# Patient Record
Sex: Male | Born: 1990 | Race: Black or African American | Hispanic: No | Marital: Single | Smoking: Current every day smoker
Health system: Southern US, Community
[De-identification: ages and names within clinical notes are randomized; demographics above are authoritative.]

---
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
## 2017-05-11 ENCOUNTER — Emergency Department (HOSPITAL_COMMUNITY)
Admission: EM | Admit: 2017-05-11 | Discharge: 2017-05-11 | Disposition: A | Discharge status: Home / Self Care | Payer: Self-pay | Attending: Emergency Medicine | Admitting: Emergency Medicine

## 2017-05-11 ENCOUNTER — Other Ambulatory Visit: Payer: Self-pay

## 2017-05-11 ENCOUNTER — Encounter (HOSPITAL_COMMUNITY): Payer: Self-pay

## 2017-05-11 DIAGNOSIS — N390 Urinary tract infection, site not specified: Secondary | ICD-10-CM

## 2017-05-11 DIAGNOSIS — F17210 Nicotine dependence, cigarettes, uncomplicated: Secondary | ICD-10-CM | POA: Insufficient documentation

## 2017-05-11 DIAGNOSIS — R310 Gross hematuria: Secondary | ICD-10-CM

## 2017-05-11 LAB — URINALYSIS, ROUTINE W REFLEX MICROSCOPIC
Bacteria, UA: NONE SEEN
Bilirubin Urine: NEGATIVE
Glucose, UA: NEGATIVE mg/dL
Ketones, ur: NEGATIVE mg/dL
Nitrite: POSITIVE — AB
Protein, ur: NEGATIVE mg/dL
Specific Gravity, Urine: 1.019 (ref 1.005–1.030)
pH: 5 (ref 5.0–8.0)

## 2017-05-11 MED ORDER — AZITHROMYCIN 250 MG PO TABS
1000.0000 mg | ORAL_TABLET | Freq: Once | ORAL | Status: AC
  Administered 2017-05-11: 1000 mg via ORAL
  Filled 2017-05-11: qty 4

## 2017-05-11 MED ORDER — CEPHALEXIN 500 MG PO CAPS: 500.0000 mg | ORAL_CAPSULE | Freq: Four times a day (QID) | ORAL | 0 refills | Status: AC

## 2017-05-11 MED ORDER — CEFTRIAXONE SODIUM 250 MG IJ SOLR
250.0000 mg | Freq: Once | INTRAMUSCULAR | Status: AC
  Administered 2017-05-11: 250 mg via INTRAMUSCULAR
  Filled 2017-05-11: qty 250

## 2017-05-11 NOTE — Discharge Instructions (Addendum)
It was our pleasure to provide your ER care today - we hope that you feel better.  Take antibiotic as prescribed.   Follow up with primary care doctor in 1 week if symptoms fail to improve/resolve.  Return to ER if worse, new symptoms, severe abdominal pain, unable to urinate, high fevers, other concern.

## 2017-05-11 NOTE — ED Triage Notes (Signed)
States this am woke up with blood in urine no pain voiced no discharge voiced no back pain voiced no fever voiced.

## 2017-05-11 NOTE — ED Notes (Signed)
Bed: WA03 Expected date:  Expected time:  Means of arrival:  Comments: 

## 2017-05-11 NOTE — ED Provider Notes (Signed)
[facility]-EMERGENCY DEPT Provider Note   CSN: [id number] Arrival date & time: 05/11/17  1923     History   Chief Complaint Chief Complaint  Patient presents with  . Hematuria    HPI Stephen Velez is a 27 y.o. male.  Patient with hematuria, ?slight dysuria, onset today. No hx same. Sexually active 1-2 weeks ago, no known std exposure. No hx uti. Denies abd pain or flank pain. Feels is making normal amount urine. No trauma or fall. No flank, abd or back contusion. Denies fever or chills. Does not feel ill or sick. No scrotal or testicular pain. No d/c.  No anticoag use. No other abnormal bruising or bleeding.   The history is provided by the patient.  Hematuria  Pertinent negatives include no abdominal pain.    History reviewed. No pertinent past medical history.  Patient Active Problem List   Diagnosis Date Noted  . OM, ACUTE SUPPURATIVE NOS 04/15/2006  . ATTENTION DEFICIT, W/O HYPERACTIVITY 04/03/2006    History reviewed. No pertinent surgical history.      Home Medications    Prior to Admission medications   Not on File    Family History History reviewed. No pertinent family history.  Social History Social History   Tobacco Use  . Smoking status: Current Every Day Smoker  . Smokeless tobacco: Never Used  Substance Use Topics  . Alcohol use: Yes  . Drug use: Never     Allergies   Patient has no known allergies.   Review of Systems Review of Systems  Constitutional: Negative for chills and fever.  Gastrointestinal: Negative for abdominal pain and vomiting.  Genitourinary: Positive for hematuria. Negative for flank pain.  Musculoskeletal: Negative for back pain.  Skin: Negative for rash.     Physical Exam Updated Vital Signs BP (!) 149/98 (BP Location: Left Arm)   Pulse (!) 101   Temp 98.2 F (36.8 C) (Oral)   Resp 18   Ht 1.753 m (5\' 9" )   Wt 91.6 kg (202 lb)   SpO2 99%   BMI 29.83 kg/m   Physical Exam    Constitutional: He appears well-developed and well-nourished. No distress.  Eyes: Conjunctivae are normal.  Neck: Neck supple. No tracheal deviation present.  Pulmonary/Chest: Effort normal. No accessory muscle usage. No respiratory distress.  Abdominal: Soft. He exhibits no distension. There is no tenderness.  Genitourinary:  Genitourinary Comments: Scant discharge. No scrotal or testicular pain, swelling or tenderness. No cva tenderness.  Musculoskeletal: He exhibits no edema.  Neurological: He is alert.  Skin: Skin is warm and dry. No rash noted. He is not diaphoretic.  Psychiatric: He has a normal mood and affect.  Nursing note and vitals reviewed.    ED Treatments / Results  Labs (all labs ordered are listed, but only abnormal results are displayed) Results for orders placed or performed during the hospital encounter of 05/11/17  Urinalysis, Routine w reflex microscopic- may I&O cath if menses  Result Value Ref Range   Color, Urine AMBER (A) YELLOW   APPearance CLOUDY (A) CLEAR   Specific Gravity, Urine 1.019 1.005 - 1.030   pH 5.0 5.0 - 8.0   Glucose, UA NEGATIVE NEGATIVE mg/dL   Hgb urine dipstick MODERATE (A) NEGATIVE   Bilirubin Urine NEGATIVE NEGATIVE   Ketones, ur NEGATIVE NEGATIVE mg/dL   Protein, ur NEGATIVE NEGATIVE mg/dL   Nitrite POSITIVE (A) NEGATIVE   Leukocytes, UA SMALL (A) NEGATIVE   RBC / HPF TOO NUMEROUS TO  COUNT 0 - 5 RBC/hpf   WBC, UA TOO NUMEROUS TO COUNT 0 - 5 WBC/hpf   Bacteria, UA NONE SEEN NONE SEEN   Squamous Epithelial / LPF 0-5 (A) NONE SEEN   Mucus PRESENT     EKG None  Radiology No results found.  Procedures Procedures (including critical care time)  Medications Ordered in ED Medications  cefTRIAXone (ROCEPHIN) injection 250 mg (has no administration in time range)  azithromycin (ZITHROMAX) tablet 1,000 mg (has no administration in time range)     Initial Impression / Assessment and Plan / ED Course  I have reviewed the  triage vital signs and the nursing notes.  Pertinent labs & imaging results that were available during my care of the patient were reviewed by me and considered in my medical decision making (see chart for details).  ua sent.   Labs reviewed - tntc rbc, wbc, c/w uti.  Urethral swab sent.  Confirmed nkda w pt.  Reviewed nursing notes and prior charts for additional history.   Rocephin im. zitrhomax po.  rx keflex for home.     Final Clinical Impressions(s) / ED Diagnoses   Final diagnoses:  None    ED Discharge Orders    None       Cathren LaineSteinl, Shereka Lafortune, MD 05/11/17 [id number]

## 2017-05-12 LAB — GC/CHLAMYDIA PROBE AMP (~~LOC~~) NOT AT ARMC
CHLAMYDIA, DNA PROBE: NEGATIVE
NEISSERIA GONORRHEA: POSITIVE — AB
